# Patient Record
Sex: Female | Born: 1960 | Race: Black or African American | Hispanic: No | Smoking: Never smoker
Health system: Southern US, Community
[De-identification: ages and names within clinical notes are randomized; demographics above are authoritative.]

## PROBLEM LIST (undated history)

## (undated) DIAGNOSIS — I10 Essential (primary) hypertension: Secondary | ICD-10-CM

## (undated) HISTORY — PX: ABDOMINAL HYSTERECTOMY: SHX81

## (undated) HISTORY — PX: PARTIAL HYSTERECTOMY: SHX80

---

## 2004-05-10 ENCOUNTER — Ambulatory Visit: Payer: Self-pay | Admitting: Internal Medicine

## 2006-04-10 ENCOUNTER — Ambulatory Visit: Payer: Self-pay | Admitting: Nurse Practitioner

## 2007-12-23 ENCOUNTER — Ambulatory Visit: Payer: Self-pay | Admitting: Nurse Practitioner

## 2009-01-07 ENCOUNTER — Ambulatory Visit: Payer: Self-pay | Admitting: Nurse Practitioner

## 2010-05-22 ENCOUNTER — Ambulatory Visit: Payer: Self-pay | Admitting: Nurse Practitioner

## 2010-05-26 ENCOUNTER — Ambulatory Visit: Payer: Self-pay | Admitting: Nurse Practitioner

## 2011-07-11 ENCOUNTER — Ambulatory Visit: Payer: Self-pay | Admitting: Nurse Practitioner

## 2013-01-08 ENCOUNTER — Ambulatory Visit: Payer: Self-pay | Admitting: Family Medicine

## 2013-03-06 ENCOUNTER — Ambulatory Visit: Payer: Self-pay | Admitting: Family Medicine

## 2014-03-15 ENCOUNTER — Ambulatory Visit: Payer: Self-pay | Admitting: Family Medicine

## 2014-03-19 ENCOUNTER — Ambulatory Visit: Payer: Self-pay | Admitting: Family Medicine

## 2015-01-14 ENCOUNTER — Encounter: Payer: Self-pay | Admitting: Emergency Medicine

## 2015-01-14 ENCOUNTER — Emergency Department: Payer: Worker's Compensation

## 2015-01-14 ENCOUNTER — Emergency Department
Admission: EM | Admit: 2015-01-14 | Discharge: 2015-01-14 | Disposition: A | Discharge status: Home / Self Care | Payer: Worker's Compensation | Attending: Student | Admitting: Student

## 2015-01-14 DIAGNOSIS — S0093XA Contusion of unspecified part of head, initial encounter: Secondary | ICD-10-CM | POA: Insufficient documentation

## 2015-01-14 DIAGNOSIS — Y9389 Activity, other specified: Secondary | ICD-10-CM | POA: Diagnosis not present

## 2015-01-14 DIAGNOSIS — Y998 Other external cause status: Secondary | ICD-10-CM | POA: Diagnosis not present

## 2015-01-14 DIAGNOSIS — S0990XA Unspecified injury of head, initial encounter: Secondary | ICD-10-CM | POA: Diagnosis present

## 2015-01-14 DIAGNOSIS — I10 Essential (primary) hypertension: Secondary | ICD-10-CM | POA: Diagnosis not present

## 2015-01-14 DIAGNOSIS — W228XXA Striking against or struck by other objects, initial encounter: Secondary | ICD-10-CM | POA: Diagnosis not present

## 2015-01-14 DIAGNOSIS — Y9289 Other specified places as the place of occurrence of the external cause: Secondary | ICD-10-CM | POA: Insufficient documentation

## 2015-01-14 HISTORY — DX: Essential (primary) hypertension: I10

## 2015-01-14 MED ORDER — TRAMADOL HCL 50 MG PO TABS: 50.0000 mg | ORAL_TABLET | Freq: Four times a day (QID) | ORAL | Status: AC | PRN

## 2015-01-14 MED ORDER — ACETAMINOPHEN 500 MG PO TABS: 1000.0000 mg | ORAL_TABLET | Freq: Four times a day (QID) | ORAL | Status: AC | PRN

## 2015-01-14 MED ORDER — CLONIDINE HCL 0.1 MG PO TABS
ORAL_TABLET | ORAL | Status: AC
  Administered 2015-01-14: 0.1 mg via ORAL
  Filled 2015-01-14: qty 1

## 2015-01-14 MED ORDER — CLONIDINE HCL 0.1 MG PO TABS
0.1000 mg | ORAL_TABLET | Freq: Once | ORAL | Status: AC
  Administered 2015-01-14: 0.1 mg via ORAL
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
## 2015-01-14 NOTE — Discharge Instructions (Signed)
Concussion A concussion, or closed-head injury, is a brain injury caused by a direct blow to the head or by a quick and sudden movement (jolt) of the head or neck. Concussions are usually not life-threatening. Even so, the effects of a concussion can be serious. If you have had a concussion before, you are more likely to experience concussion-like symptoms after a direct blow to the head.  CAUSES  Direct blow to the head, such as from running into another player during a soccer game, being hit in a fight, or hitting your head on a hard surface.  A jolt of the head or neck that causes the brain to move back and forth inside the skull, such as in a car crash. SIGNS AND SYMPTOMS The signs of a concussion can be hard to notice. Early on, they may be missed by you, family members, and health care providers. You may look fine but act or feel differently. Symptoms are usually temporary, but they may last for days, weeks, or even longer. Some symptoms may appear right away while others may not show up for hours or days. Every head injury is different. Symptoms include:  Mild to moderate headaches that will not go away.  A feeling of pressure inside your head.  Having more trouble than usual:  Learning or remembering things you have heard.  Answering questions.  Paying attention or concentrating.  Organizing daily tasks.  Making decisions and solving problems.  Slowness in thinking, acting or reacting, speaking, or reading.  Getting lost or being easily confused.  Feeling tired all the time or lacking energy (fatigued).  Feeling drowsy.  Sleep disturbances.  Sleeping more than usual.  Sleeping less than usual.  Trouble falling asleep.  Trouble sleeping (insomnia).  Loss of balance or feeling lightheaded or dizzy.  Nausea or vomiting.  Numbness or tingling.  Increased sensitivity to:  Sounds.  Lights.  Distractions.  Vision problems or eyes that tire  easily.  Diminished sense of taste or smell.  Ringing in the ears.  Mood changes such as feeling sad or anxious.  Becoming easily irritated or angry for little or no reason.  Lack of motivation.  Seeing or hearing things other people do not see or hear (hallucinations). DIAGNOSIS Your health care provider can usually diagnose a concussion based on a description of your injury and symptoms. He or she will ask whether you passed out (lost consciousness) and whether you are having trouble remembering events that happened right before and during your injury. Your evaluation might include:  A brain scan to look for signs of injury to the brain. Even if the test shows no injury, you may still have a concussion.  Blood tests to be sure other problems are not present. TREATMENT  Concussions are usually treated in an emergency department, in urgent care, or at a clinic. You may need to stay in the hospital overnight for further treatment.  Tell your health care provider if you are taking any medicines, including prescription medicines, over-the-counter medicines, and natural remedies. Some medicines, such as blood thinners (anticoagulants) and aspirin, may increase the chance of complications. Also tell your health care provider whether you have had alcohol or are taking illegal drugs. This information may affect treatment.  Your health care provider will send you home with important instructions to follow.  How fast you will recover from a concussion depends on many factors. These factors include how severe your concussion is, what part of your brain was injured, your  age, and how healthy you were before the concussion. °· Most people with mild injuries recover fully. Recovery can take time. In general, recovery is slower in older persons. Also, persons who have had a concussion in the past or have other medical problems may find that it takes longer to recover from their current injury. °HOME  CARE INSTRUCTIONS °General Instructions °· Carefully follow the directions your health care provider gave you. °· Only take over-the-counter or prescription medicines for pain, discomfort, or fever as directed by your health care provider. °· Take only those medicines that your health care provider has approved. °· Do not drink alcohol until your health care provider says you are well enough to do so. Alcohol and certain other drugs may slow your recovery and can put you at risk of further injury. °· If it is harder than usual to remember things, write them down. °· If you are easily distracted, try to do one thing at a time. For example, do not try to watch TV while fixing dinner. °· Talk with family members or close friends when making important decisions. °· Keep all follow-up appointments. Repeated evaluation of your symptoms is recommended for your recovery. °· Watch your symptoms and tell others to do the same. Complications sometimes occur after a concussion. Older adults with a brain injury may have a higher risk of serious complications, such as a blood clot on the brain. °· Tell your teachers, school nurse, school counselor, coach, athletic trainer, or work manager about your injury, symptoms, and restrictions. Tell them about what you can or cannot do. They should watch for: °¨ Increased problems with attention or concentration. °¨ Increased difficulty remembering or learning new information. °¨ Increased time needed to complete tasks or assignments. °¨ Increased irritability or decreased ability to cope with stress. °¨ Increased symptoms. °· Rest. Rest helps the brain to heal. Make sure you: °¨ Get plenty of sleep at night. Avoid staying up late at night. °¨ Keep the same bedtime hours on weekends and weekdays. °¨ Rest during the day. Take daytime naps or rest breaks when you feel tired. °· Limit activities that require a lot of thought or concentration. These include: °¨ Doing homework or job-related  work. °¨ Watching TV. °¨ Working on the computer. °· Avoid any situation where there is potential for another head injury (football, hockey, soccer, basketball, martial arts, downhill snow sports and horseback riding). Your condition will get worse every time you experience a concussion. You should avoid these activities until you are evaluated by the appropriate follow-up health care providers. °Returning To Your Regular Activities °You will need to return to your normal activities slowly, not all at once. You must give your body and brain enough time for recovery. °· Do not return to sports or other athletic activities until your health care provider tells you it is safe to do so. °· Ask your health care provider when you can drive, ride a bicycle, or operate heavy machinery. Your ability to react may be slower after a brain injury. Never do these activities if you are dizzy. °· Ask your health care provider about when you can return to work or school. °Preventing Another Concussion °It is very important to avoid another brain injury, especially before you have recovered. In rare cases, another injury can lead to permanent brain damage, brain swelling, or death. The risk of this is greatest during the first 7-10 days after a head injury. Avoid injuries by: °· Wearing a seat   belt when riding in a car.  Drinking alcohol only in moderation.  Wearing a helmet when biking, skiing, skateboarding, skating, or doing similar activities.  Avoiding activities that could lead to a second concussion, such as contact or recreational sports, until your health care provider says it is okay.  Taking safety measures in your home.  Remove clutter and tripping hazards from floors and stairways.  Use grab bars in bathrooms and handrails by stairs.  Place non-slip mats on floors and in bathtubs.  Improve lighting in dim areas. SEEK MEDICAL CARE IF:  You have increased problems paying attention or  concentrating.  You have increased difficulty remembering or learning new information.  You need more time to complete tasks or assignments than before.  You have increased irritability or decreased ability to cope with stress.  You have more symptoms than before. Seek medical care if you have any of the following symptoms for more than 2 weeks after your injury:  Lasting (chronic) headaches.  Dizziness or balance problems.  Nausea.  Vision problems.  Increased sensitivity to noise or light.  Depression or mood swings.  Anxiety or irritability.  Memory problems.  Difficulty concentrating or paying attention.  Sleep problems.  Feeling tired all the time. SEEK IMMEDIATE MEDICAL CARE IF:  You have severe or worsening headaches. These may be a sign of a blood clot in the brain.  You have weakness (even if only in one hand, leg, or part of the face).  You have numbness.  You have decreased coordination.  You vomit repeatedly.  You have increased sleepiness.  One pupil is larger than the other.  You have convulsions.  You have slurred speech.  You have increased confusion. This may be a sign of a blood clot in the brain.  You have increased restlessness, agitation, or irritability.  You are unable to recognize people or places.  You have neck pain.  It is difficult to wake you up.  You have unusual behavior changes.  You lose consciousness. MAKE SURE YOU:  Understand these instructions.  Will watch your condition.  Will get help right away if you are not doing well or get worse. Document Released: 09/22/2003 Document Revised: 07/07/2013 Document Reviewed: 01/22/2013 [facility] Patient Information 2015 [place], [place]. This information is not intended to replace advice given to you by your health care provider. Make sure you discuss any questions you have with your health care provider.  Blunt Trauma You have been evaluated for injuries. You have  been examined and your caregiver has not found injuries serious enough to require hospitalization. It is common to have multiple bruises and sore muscles following an accident. These tend to feel worse for the first 24 hours. You will feel more stiffness and soreness over the next several hours and worse when you wake up the first morning after your accident. After this point, you should begin to improve with each passing day. The amount of improvement depends on the amount of damage done in the accident. Following your accident, if some part of your body does not work as it should, or if the pain in any area continues to increase, you should return to the Emergency Department for re-evaluation.  HOME CARE INSTRUCTIONS  Routine care for sore areas should include:  Ice to sore areas every 2 hours for 20 minutes while awake for the next 2 days.  Drink extra fluids (not alcohol).  Take a hot or warm shower or bath once or twice a day  to increase blood flow to sore muscles. This will help you "limber up".  Activity as tolerated. Lifting may aggravate neck or back pain.  Only take over-the-counter or prescription medicines for pain, discomfort, or fever as directed by your caregiver. Do not use aspirin. This may increase bruising or increase bleeding if there are small areas where this is happening. SEEK IMMEDIATE MEDICAL CARE IF:  Numbness, tingling, weakness, or problem with the use of your arms or legs.  A severe headache is not relieved with medications.  There is a change in bowel or bladder control.  Increasing pain in any areas of the body.  Short of breath or dizzy.  Nauseated, vomiting, or sweating.  Increasing belly (abdominal) discomfort.  Blood in urine, stool, or vomiting blood.  Pain in either shoulder in an area where a shoulder strap would be.  Feelings of lightheadedness or if you have a fainting episode. Sometimes it is not possible to identify all injuries  immediately after the trauma. It is important that you continue to monitor your condition after the emergency department visit. If you feel you are not improving, or improving more slowly than should be expected, call your physician. If you feel your symptoms (problems) are worsening, return to the Emergency Department immediately. Document Released: 03/28/2001 Document Revised: 09/24/2011 Document Reviewed: 02/18/2008 [facility] Patient Information 2015 [place], [place]. This information is not intended to replace advice given to you by your health care provider. Make sure you discuss any questions you have with your health care provider.  Contusion A contusion is a deep bruise. Contusions are the result of an injury that caused bleeding under the skin. The contusion may turn blue, purple, or yellow. Minor injuries will give you a painless contusion, but more severe contusions may stay painful and swollen for a few weeks.  CAUSES  A contusion is usually caused by a blow, trauma, or direct force to an area of the body. SYMPTOMS   Swelling and redness of the injured area.  Bruising of the injured area.  Tenderness and soreness of the injured area.  Pain. DIAGNOSIS  The diagnosis can be made by taking a history and physical exam. An X-ray, CT scan, or MRI may be needed to determine if there were any associated injuries, such as fractures. TREATMENT  Specific treatment will depend on what area of the body was injured. In general, the best treatment for a contusion is resting, icing, elevating, and applying cold compresses to the injured area. Over-the-counter medicines may also be recommended for pain control. Ask your caregiver what the best treatment is for your contusion. HOME CARE INSTRUCTIONS   Put ice on the injured area.  Put ice in a plastic bag.  Place a towel between your skin and the bag.  Leave the ice on for 15-20 minutes, 3-4 times a day, or as directed by your health care  provider.  Only take over-the-counter or prescription medicines for pain, discomfort, or fever as directed by your caregiver. Your caregiver may recommend avoiding anti-inflammatory medicines (aspirin, ibuprofen, and naproxen) for 48 hours because these medicines may increase bruising.  Rest the injured area.  If possible, elevate the injured area to reduce swelling. SEEK IMMEDIATE MEDICAL CARE IF:   You have increased bruising or swelling.  You have pain that is getting worse.  Your swelling or pain is not relieved with medicines. MAKE SURE YOU:   Understand these instructions.  Will watch your condition.  Will get help right away if you  are not doing well or get worse. Document Released: 04/11/2005 Document Revised: 07/07/2013 Document Reviewed: 05/07/2011 [facility] Patient Information 2015 [place]. This information is not intended to replace advice given to you by your health care provider. Make sure you discuss any questions you have with your health care provider.
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
## 2015-10-13 ENCOUNTER — Other Ambulatory Visit: Payer: Self-pay | Admitting: Physician Assistant

## 2015-10-13 DIAGNOSIS — Z1231 Encounter for screening mammogram for malignant neoplasm of breast: Secondary | ICD-10-CM

## 2015-12-14 ENCOUNTER — Ambulatory Visit
Admission: RE | Admit: 2015-12-14 | Discharge: 2015-12-14 | Disposition: A | Discharge status: Home / Self Care | Payer: BLUE CROSS/BLUE SHIELD | Source: Ambulatory Visit | Attending: Physician Assistant | Admitting: Physician Assistant

## 2015-12-14 DIAGNOSIS — Z1231 Encounter for screening mammogram for malignant neoplasm of breast: Secondary | ICD-10-CM | POA: Diagnosis present

## 2016-10-13 ENCOUNTER — Emergency Department
Admission: EM | Admit: 2016-10-13 | Discharge: 2016-10-13 | Disposition: A | Discharge status: Home / Self Care | Payer: BLUE CROSS/BLUE SHIELD | Attending: Emergency Medicine | Admitting: Emergency Medicine

## 2016-10-13 ENCOUNTER — Encounter: Payer: Self-pay | Admitting: Emergency Medicine

## 2016-10-13 ENCOUNTER — Emergency Department: Payer: BLUE CROSS/BLUE SHIELD

## 2016-10-13 DIAGNOSIS — Z79899 Other long term (current) drug therapy: Secondary | ICD-10-CM | POA: Diagnosis not present

## 2016-10-13 DIAGNOSIS — I10 Essential (primary) hypertension: Secondary | ICD-10-CM | POA: Insufficient documentation

## 2016-10-13 DIAGNOSIS — R109 Unspecified abdominal pain: Secondary | ICD-10-CM | POA: Insufficient documentation

## 2016-10-13 DIAGNOSIS — M545 Low back pain: Secondary | ICD-10-CM | POA: Insufficient documentation

## 2016-10-13 DIAGNOSIS — R52 Pain, unspecified: Secondary | ICD-10-CM

## 2016-10-13 LAB — CBC WITH DIFFERENTIAL/PLATELET
Basophils Absolute: 0.1 10*3/uL (ref 0–0.1)
Basophils Relative: 1 %
EOS ABS: 0.1 10*3/uL (ref 0–0.7)
Eosinophils Relative: 2 %
HCT: 41 % (ref 35.0–47.0)
Hemoglobin: 14 g/dL (ref 12.0–16.0)
Lymphocytes Relative: 16 %
Lymphs Abs: 1.4 10*3/uL (ref 1.0–3.6)
MCH: 30.3 pg (ref 26.0–34.0)
MCHC: 34.1 g/dL (ref 32.0–36.0)
MCV: 88.9 fL (ref 80.0–100.0)
MONO ABS: 0.7 10*3/uL (ref 0.2–0.9)
MONOS PCT: 8 %
Neutro Abs: 6.7 10*3/uL — ABNORMAL HIGH (ref 1.4–6.5)
Neutrophils Relative %: 73 %
PLATELETS: 252 10*3/uL (ref 150–440)
RBC: 4.61 MIL/uL (ref 3.80–5.20)
RDW: 13.7 % (ref 11.5–14.5)
WBC: 9.1 10*3/uL (ref 3.6–11.0)

## 2016-10-13 LAB — COMPREHENSIVE METABOLIC PANEL
ALT: 28 U/L (ref 14–54)
AST: 28 U/L (ref 15–41)
Albumin: 4.4 g/dL (ref 3.5–5.0)
Alkaline Phosphatase: 73 U/L (ref 38–126)
Anion gap: 7 (ref 5–15)
BILIRUBIN TOTAL: 0.6 mg/dL (ref 0.3–1.2)
BUN: 19 mg/dL (ref 6–20)
CALCIUM: 9.9 mg/dL (ref 8.9–10.3)
CO2: 32 mmol/L (ref 22–32)
Chloride: 100 mmol/L — ABNORMAL LOW (ref 101–111)
Creatinine, Ser: 1.43 mg/dL — ABNORMAL HIGH (ref 0.44–1.00)
GFR calc Af Amer: 46 mL/min — ABNORMAL LOW (ref >60)
GFR, EST NON AFRICAN AMERICAN: 40 mL/min — AB (ref >60)
GLUCOSE: 87 mg/dL (ref 65–99)
POTASSIUM: 2.8 mmol/L — AB (ref 3.5–5.1)
Sodium: 139 mmol/L (ref 135–145)
TOTAL PROTEIN: 8.4 g/dL — AB (ref 6.5–8.1)

## 2016-10-13 LAB — URINALYSIS, COMPLETE (UACMP) WITH MICROSCOPIC
Bacteria, UA: NONE SEEN
Bilirubin Urine: NEGATIVE
GLUCOSE, UA: NEGATIVE mg/dL
HGB URINE DIPSTICK: NEGATIVE
Ketones, ur: NEGATIVE mg/dL
Leukocytes, UA: NEGATIVE
NITRITE: NEGATIVE
PH: 5 (ref 5.0–8.0)
Protein, ur: 30 mg/dL — AB
SPECIFIC GRAVITY, URINE: 1.013 (ref 1.005–1.030)
WBC, UA: NONE SEEN WBC/hpf (ref 0–5)

## 2016-10-13 LAB — POCT PREGNANCY, URINE: Preg Test, Ur: NEGATIVE

## 2016-10-13 LAB — LIPASE, BLOOD: LIPASE: 16 U/L (ref 11–51)

## 2016-10-13 LAB — PREGNANCY, URINE: Preg Test, Ur: NEGATIVE

## 2016-10-13 MED ORDER — MORPHINE SULFATE (PF) 4 MG/ML IV SOLN
6.0000 mg | Freq: Once | INTRAVENOUS | Status: AC
  Administered 2016-10-13: 6 mg via INTRAVENOUS
  Filled 2016-10-13: qty 2

## 2016-10-13 MED ORDER — HYDROCODONE-ACETAMINOPHEN 5-325 MG PO TABS: 2.0000 | ORAL_TABLET | Freq: Four times a day (QID) | ORAL | 0 refills | Status: AC | PRN

## 2016-10-13 MED ORDER — ONDANSETRON HCL 4 MG/2ML IJ SOLN
4.0000 mg | Freq: Once | INTRAMUSCULAR | Status: AC
  Administered 2016-10-13: 4 mg via INTRAVENOUS
  Filled 2016-10-13: qty 2

## 2016-10-13 MED ORDER — MORPHINE SULFATE (PF) 4 MG/ML IV SOLN
4.0000 mg | Freq: Once | INTRAVENOUS | Status: AC
  Administered 2016-10-13: 4 mg via INTRAVENOUS
  Filled 2016-10-13: qty 1

## 2016-10-13 MED ORDER — POTASSIUM CHLORIDE 20 MEQ/15ML (10%) PO SOLN
40.0000 meq | Freq: Once | ORAL | Status: DC
  Filled 2016-10-13: qty 30

## 2016-10-13 MED ORDER — POTASSIUM CHLORIDE CRYS ER 20 MEQ PO TBCR
40.0000 meq | EXTENDED_RELEASE_TABLET | Freq: Once | ORAL | Status: AC
  Administered 2016-10-13: 40 meq via ORAL
  Filled 2016-10-13: qty 2

## 2016-10-13 NOTE — Discharge Instructions (Signed)
Use the Vicodin one or 2 pills 4 times a day as needed for pain. Use ice or heat to the area 20 minutes every hour to help with the pain. Do not fall asleep on either ice or heat as you can get burns or frostbite. With your regular doctor next 3-4 days unless you're well. Return here for worse pain fever or numbness or weakness or feeling sicker.

## 2016-10-13 NOTE — ED Triage Notes (Signed)
Pt reports right side flank pain and low back pain on right side. Pt denies dysuria. Pt denies NVD. Pt tearful in triage.

## 2016-10-13 NOTE — ED Provider Notes (Signed)
[facility] Emergency Department Provider Note   ____________________________________________   First MD Initiated Contact with Patient 10/13/16 1128     (approximate)  I have reviewed the triage vital signs and the nursing notes.   HISTORY  Chief Complaint Flank Pain    HPI Diana Boone is a 56 y.o. female who reports right flank pain starting yesterday around 3:00. Pain is sharp worse with movement. Chest no fever chills nausea vomiting diarrhea dysuria urgency frequency or any other complaints. Pain is actually closer to the right sacroiliac joint than anywhere else.  Past Medical History:  Diagnosis Date  . Hypertension     There are no active problems to display for this patient.   Past Surgical History:  Procedure Laterality Date  . PARTIAL HYSTERECTOMY      Prior to Admission medications   Medication Sig Start Date End Date Taking? Authorizing Provider  acetaminophen (TYLENOL) 325 MG tablet Take 650 mg by mouth every 6 (six) hours as needed.   Yes Historical Provider, MD  cetirizine (ZYRTEC) 10 MG tablet Take 10 mg by mouth daily.   Yes Historical Provider, MD  losartan-hydrochlorothiazide (HYZAAR) 100-12.5 MG tablet Take 1 tablet by mouth daily. 09/13/16  Yes Historical Provider, MD  HYDROcodone-acetaminophen (NORCO/VICODIN) 5-325 MG tablet Take 2 tablets by mouth every 6 (six) hours as needed for moderate pain. 10/13/16 10/13/17  Arnaldo Natal, MD  traMADol (ULTRAM) 50 MG tablet Take 1 tablet (50 mg total) by mouth every 6 (six) hours as needed. Patient not taking: Reported on 10/13/2016 01/14/15   Evon Slack, PA-C    Allergies Ibuprofen  Family History  Problem Relation Age of Onset  . Breast cancer Sister 73    Social History Social History  Substance Use Topics  . Smoking status: Never Smoker  . Smokeless tobacco: Not on file  . Alcohol use No    Review of Systems Constitutional: No fever/chills Eyes: No visual  changes. ENT: No sore throat. Cardiovascular: Denies chest pain. Respiratory: Denies shortness of breath. Gastrointestinal: No abdominal pain.  No nausea, no vomiting.  No diarrhea.  No constipation. Genitourinary: Negative for dysuria. Musculoskeletal: Negative for back pain. Skin: Negative for rash. Neurological: Negative for headaches, focal weakness or numbness. { 10-point ROS otherwise negative.  ____________________________________________   PHYSICAL EXAM:  VITAL SIGNS: ED Triage Vitals  Enc Vitals Group     BP 10/13/16 1117 (!) 170/119     Pulse Rate 10/13/16 1117 69     Resp 10/13/16 1117 18     Temp 10/13/16 1117 97.5 F (36.4 C)     Temp Source 10/13/16 1117 Oral     SpO2 10/13/16 1117 99 %     Weight 10/13/16 1118 160 lb (72.6 kg)     Height 10/13/16 1118  (1.575 m)     Head Circumference --      Peak Flow --      Pain Score 10/13/16 1117 10     Pain Loc --      Pain Edu? --      Excl. in GC? --     Constitutional: Alert and oriented. Well appearing and in no acute distress. Eyes: Conjunctivae are normal. PERRL. EOMI. Head: Atraumatic. Nose: No congestion/rhinnorhea. Mouth/Throat: Mucous membranes are moist.  Oropharynx non-erythematous. Neck: No stridor.  Cardiovascular: Normal rate, regular rhythm. Grossly normal heart sounds.  Good peripheral circulation. Respiratory: Normal respiratory effort.  No retractions. Lungs CTAB. Gastrointestinal: Soft and nontender. No  distention. No abdominal bruits. No CVA tenderness.Patient does have pain to palpation over the area of the right SI joint. Musculoskeletal: No lower extremity tenderness nor edema.  No joint effusions. Neurologic:  Normal speech and language. No gross focal neurologic deficits are appreciated. No gait instability. Skin:  Skin is warm, dry and intact. No rash noted. Psychiatric: Mood and affect are normal. Speech and behavior are normal.  ____________________________________________     LABS (all labs ordered are listed, but only abnormal results are displayed)  Labs Reviewed  COMPREHENSIVE METABOLIC PANEL - Abnormal; Notable for the following:       Result Value   Potassium 2.8 (*)    Chloride 100 (*)    Creatinine, Ser 1.43 (*)    Total Protein 8.4 (*)    GFR calc non Af Amer 40 (*)    GFR calc Af Amer 46 (*)    All other components within normal limits  CBC WITH DIFFERENTIAL/PLATELET - Abnormal; Notable for the following:    Neutro Abs 6.7 (*)    All other components within normal limits  URINALYSIS, COMPLETE (UACMP) WITH MICROSCOPIC - Abnormal; Notable for the following:    Color, Urine YELLOW (*)    APPearance CLEAR (*)    Protein, ur 30 (*)    Squamous Epithelial / LPF 0-5 (*)    All other components within normal limits  LIPASE, BLOOD  PREGNANCY, URINE  POCT PREGNANCY, URINE   ____________________________________________  EKG   ____________________________________________  RADIOLOGY  udy Result   CLINICAL DATA:  Pt reports right side flank pain and low back pain on right side. Pt denies dysuria. Pt denies NVD. Denies hx of kidney stones.  EXAM: CT ABDOMEN AND PELVIS WITHOUT CONTRAST  TECHNIQUE: Multidetector CT imaging of the abdomen and pelvis was performed following the standard protocol without IV contrast.  COMPARISON:  None.  FINDINGS: Lower chest: No acute abnormality.  Hepatobiliary: 1 cm low-density lesion arises from segment 8 of the liver. This is not consistent with a cyst. It may reflect a small hemangioma but is nonspecific. No other liver abnormality. Normal gallbladder. No bile duct dilation.  Pancreas: Unremarkable. No pancreatic ductal dilatation or surrounding inflammatory changes.  Spleen: Normal in size without focal abnormality.  Adrenals/Urinary Tract: Adrenal glands are unremarkable. Kidneys are normal, without renal calculi, focal lesion, or hydronephrosis. Bladder is  unremarkable.  Stomach/Bowel: Stomach, small bowel and colon are unremarkable. Appendix not visualized. No evidence of appendicitis.  Vascular/Lymphatic: No significant vascular findings are present. No enlarged abdominal or pelvic lymph nodes.  Reproductive: Status post hysterectomy. No adnexal masses.  Other: No abdominal wall hernia or abnormality. No abdominopelvic ascites.  Musculoskeletal: No acute or significant osseous findings.  IMPRESSION: 1. No acute findings. No findings to account for the patient's right flank pain. Specifically, no renal or ureteral stones or obstructive uropathy. 2. 1 cm low-density liver lesion. This is of unclear etiology. Consider further assessment with liver MRI with and without contrast. 3. Status post hysterectomy.   Electronically Signed   By: Amie Portland M.D.   On: 10/13/2016 12:14     ____________________________________________   PROCEDURES  Procedure(s) performed:   Procedures  Critical Care performed:  ____________________________________________   INITIAL IMPRESSION / ASSESSMENT AND PLAN / ED COURSE  Pertinent labs & imaging results that were available during my care of the patient were reviewed by me and considered in my medical decision making (see chart for details).        ____________________________________________  FINAL CLINICAL IMPRESSION(S) / ED DIAGNOSES  Final diagnoses:  Pain  Right flank pain      NEW MEDICATIONS STARTED DURING THIS VISIT:  New Prescriptions   HYDROCODONE-ACETAMINOPHEN (NORCO/VICODIN) 5-325 MG TABLET    Take 2 tablets by mouth every 6 (six) hours as needed for moderate pain.     Note:  This document was prepared using Dragon voice recognition software and may include unintentional dictation errors.    Arnaldo Natal, MD 10/13/16 [phone]

## 2016-10-13 NOTE — ED Notes (Signed)
Pt taken to CT via stretcher.

## 2016-11-13 ENCOUNTER — Other Ambulatory Visit: Payer: Self-pay | Admitting: Physician Assistant

## 2016-11-13 DIAGNOSIS — Z1231 Encounter for screening mammogram for malignant neoplasm of breast: Secondary | ICD-10-CM

## 2017-01-03 ENCOUNTER — Ambulatory Visit
Admission: RE | Admit: 2017-01-03 | Discharge: 2017-01-03 | Disposition: A | Discharge status: Home / Self Care | Payer: BLUE CROSS/BLUE SHIELD | Source: Ambulatory Visit | Attending: Physician Assistant | Admitting: Physician Assistant

## 2017-01-03 DIAGNOSIS — Z1231 Encounter for screening mammogram for malignant neoplasm of breast: Secondary | ICD-10-CM | POA: Diagnosis not present

## 2017-12-18 ENCOUNTER — Other Ambulatory Visit: Payer: Self-pay | Admitting: Nurse Practitioner

## 2017-12-18 DIAGNOSIS — Z1239 Encounter for other screening for malignant neoplasm of breast: Secondary | ICD-10-CM

## 2018-01-31 ENCOUNTER — Ambulatory Visit
Admission: RE | Admit: 2018-01-31 | Discharge: 2018-01-31 | Disposition: A | Discharge status: Home / Self Care | Payer: BLUE CROSS/BLUE SHIELD | Source: Ambulatory Visit | Attending: Nurse Practitioner | Admitting: Nurse Practitioner

## 2018-01-31 DIAGNOSIS — Z1231 Encounter for screening mammogram for malignant neoplasm of breast: Secondary | ICD-10-CM | POA: Diagnosis not present

## 2018-01-31 DIAGNOSIS — Z1239 Encounter for other screening for malignant neoplasm of breast: Secondary | ICD-10-CM

## 2018-12-23 ENCOUNTER — Other Ambulatory Visit: Payer: Self-pay | Admitting: Nurse Practitioner

## 2018-12-23 DIAGNOSIS — Z1231 Encounter for screening mammogram for malignant neoplasm of breast: Secondary | ICD-10-CM

## 2019-02-04 ENCOUNTER — Ambulatory Visit
Admission: RE | Admit: 2019-02-04 | Discharge: 2019-02-04 | Disposition: A | Discharge status: Home / Self Care | Payer: 59 | Source: Ambulatory Visit | Attending: Nurse Practitioner | Admitting: Nurse Practitioner

## 2019-02-04 ENCOUNTER — Other Ambulatory Visit: Payer: Self-pay

## 2019-02-04 DIAGNOSIS — Z1231 Encounter for screening mammogram for malignant neoplasm of breast: Secondary | ICD-10-CM

## 2019-07-29 ENCOUNTER — Other Ambulatory Visit: Payer: Self-pay | Admitting: Nurse Practitioner

## 2019-07-29 DIAGNOSIS — R519 Headache, unspecified: Secondary | ICD-10-CM

## 2019-08-12 ENCOUNTER — Ambulatory Visit
Admission: RE | Admit: 2019-08-12 | Discharge: 2019-08-12 | Disposition: A | Discharge status: Home / Self Care | Payer: 59 | Source: Ambulatory Visit | Attending: Nurse Practitioner | Admitting: Nurse Practitioner

## 2019-08-12 ENCOUNTER — Other Ambulatory Visit: Payer: Self-pay

## 2019-08-12 DIAGNOSIS — R519 Headache, unspecified: Secondary | ICD-10-CM | POA: Diagnosis not present

## 2020-01-27 ENCOUNTER — Other Ambulatory Visit: Payer: Self-pay | Admitting: Nurse Practitioner

## 2020-01-27 DIAGNOSIS — Z1231 Encounter for screening mammogram for malignant neoplasm of breast: Secondary | ICD-10-CM

## 2020-03-15 ENCOUNTER — Other Ambulatory Visit: Payer: Self-pay

## 2020-03-15 ENCOUNTER — Ambulatory Visit
Admission: RE | Admit: 2020-03-15 | Discharge: 2020-03-15 | Disposition: A | Discharge status: Home / Self Care | Payer: BC Managed Care – PPO | Source: Ambulatory Visit | Attending: Nurse Practitioner | Admitting: Nurse Practitioner

## 2020-03-15 DIAGNOSIS — Z1231 Encounter for screening mammogram for malignant neoplasm of breast: Secondary | ICD-10-CM

## 2021-02-28 ENCOUNTER — Other Ambulatory Visit: Payer: Self-pay | Admitting: Nurse Practitioner

## 2021-02-28 DIAGNOSIS — Z1231 Encounter for screening mammogram for malignant neoplasm of breast: Secondary | ICD-10-CM

## 2021-03-16 ENCOUNTER — Other Ambulatory Visit: Payer: Self-pay

## 2021-03-16 ENCOUNTER — Ambulatory Visit
Admission: RE | Admit: 2021-03-16 | Discharge: 2021-03-16 | Disposition: A | Discharge status: Home / Self Care | Payer: BC Managed Care – PPO | Source: Ambulatory Visit | Attending: Nurse Practitioner | Admitting: Nurse Practitioner

## 2021-03-16 DIAGNOSIS — Z1231 Encounter for screening mammogram for malignant neoplasm of breast: Secondary | ICD-10-CM | POA: Diagnosis not present

## 2021-12-11 IMAGING — MG MM DIGITAL SCREENING BILAT W/ TOMO AND CAD
8 series · 8 of 24 positions shown · non-contrast
Comparison: Previous exam(s).

CLINICAL DATA: Screening.

EXAM:
DIGITAL SCREENING BILATERAL MAMMOGRAM WITH TOMOSYNTHESIS AND CAD
TECHNIQUE: Bilateral screening digital craniocaudal and mediolateral oblique
mammograms were obtained. Bilateral screening digital breast
tomosynthesis was performed. The images were evaluated with
computer-aided detection.

[R CC synth-2D]
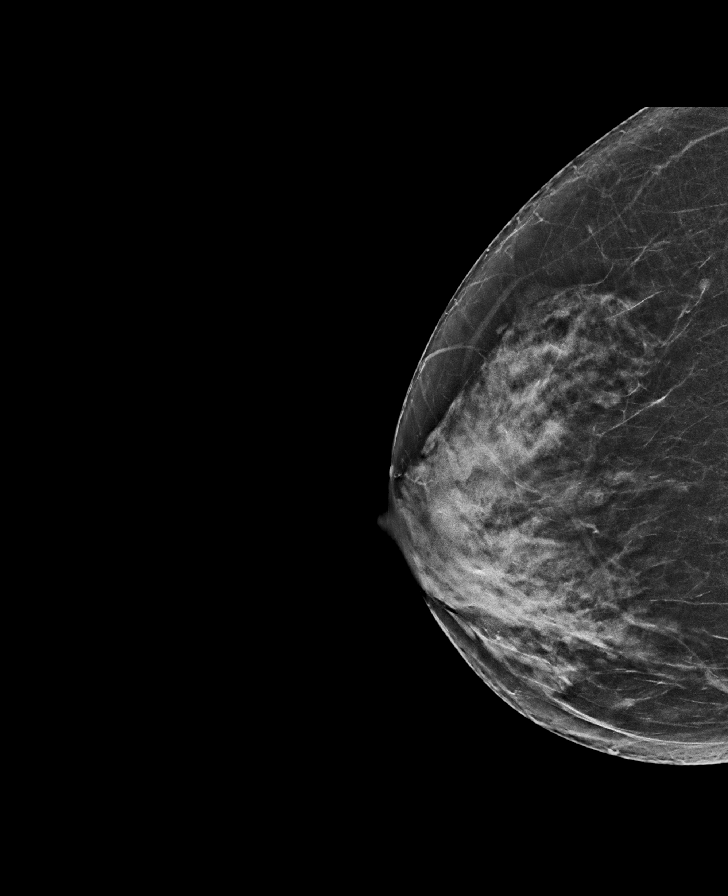

[R MLO synth-2D]
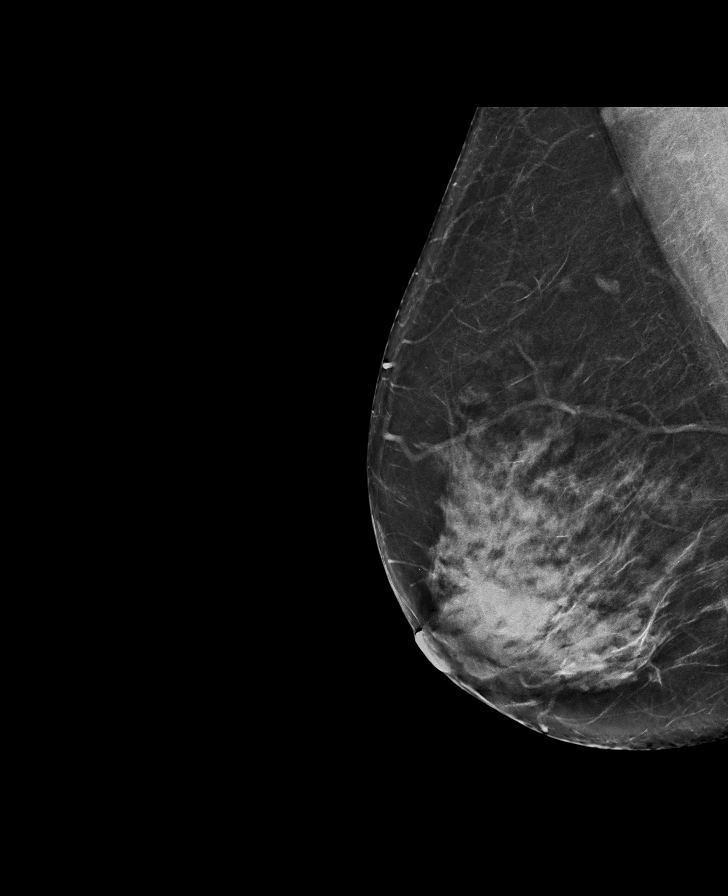

[L CC synth-2D]
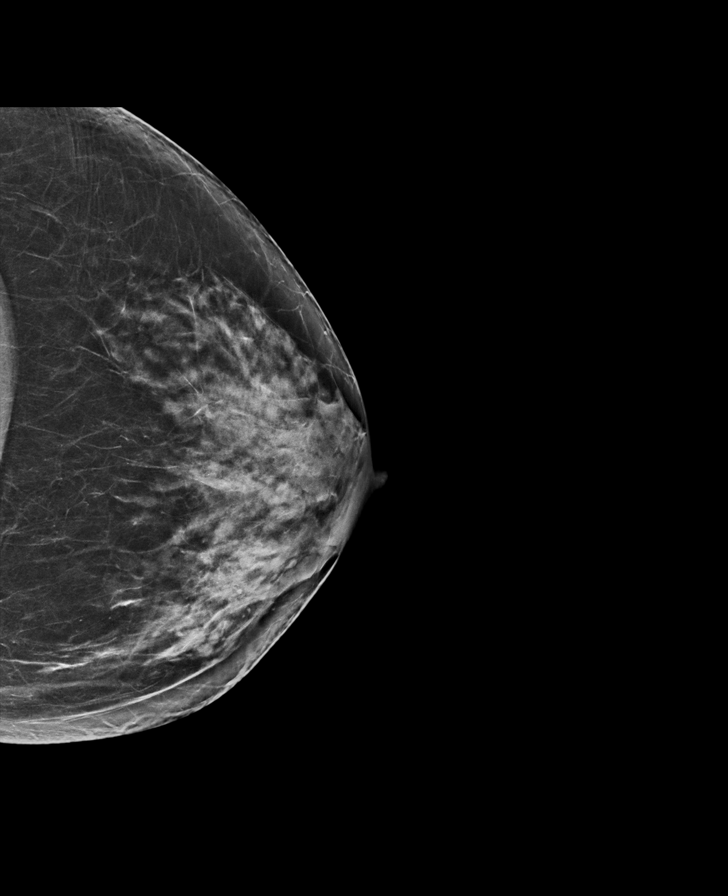

[L MLO synth-2D]
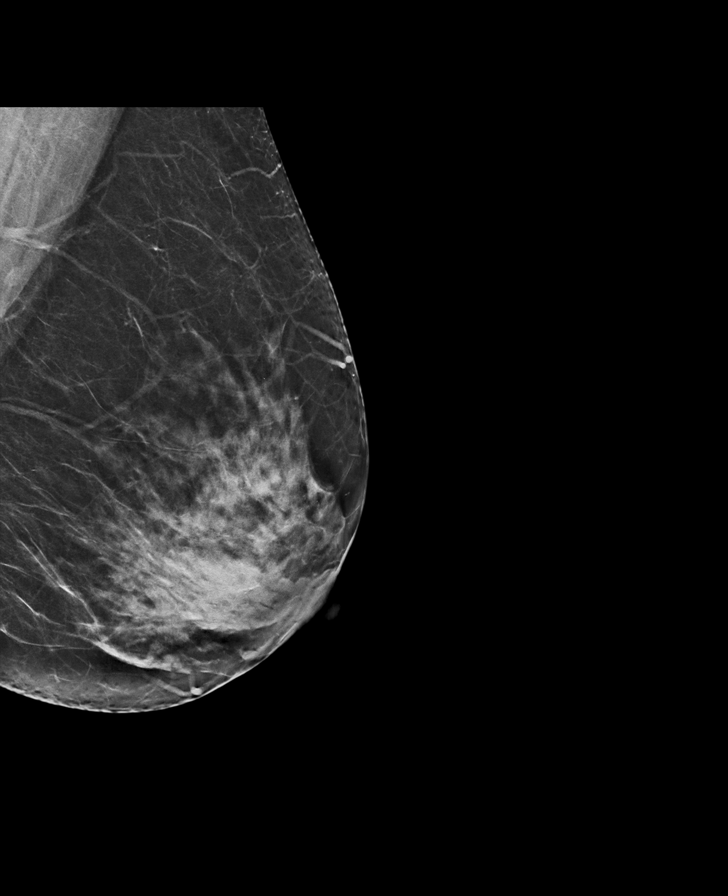

[R CC tomo · tomo slice 33/65.0]
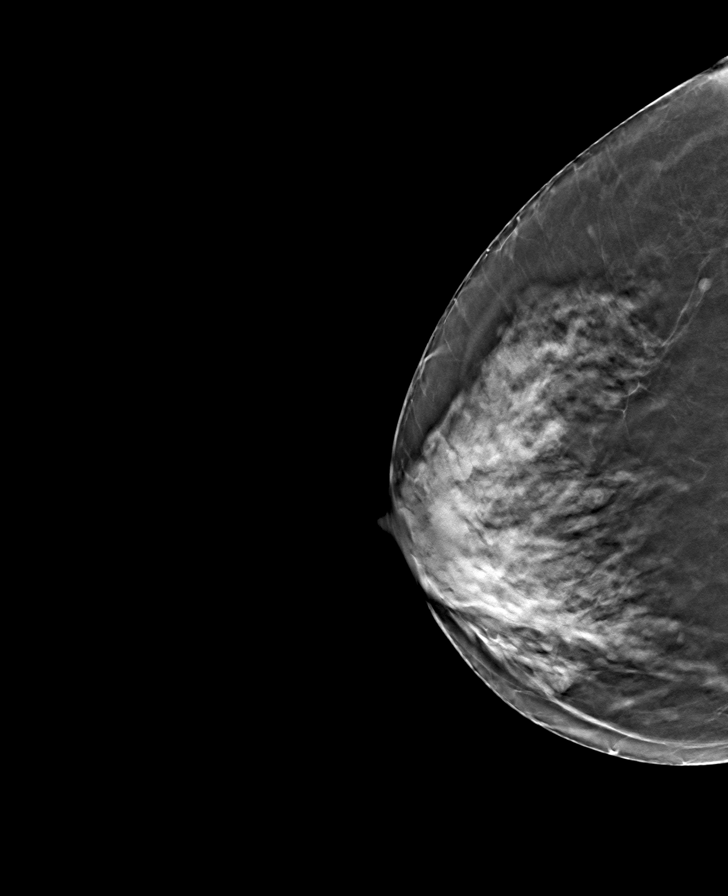

[L MLO tomo · tomo slice 38/75.0]
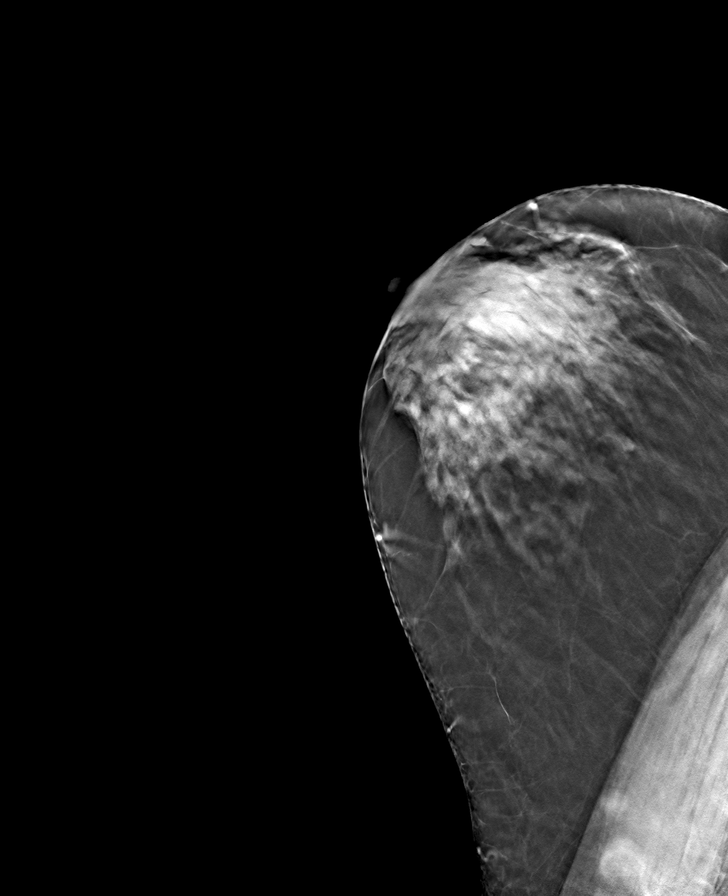

[L CC tomo · tomo slice 37/74.0]
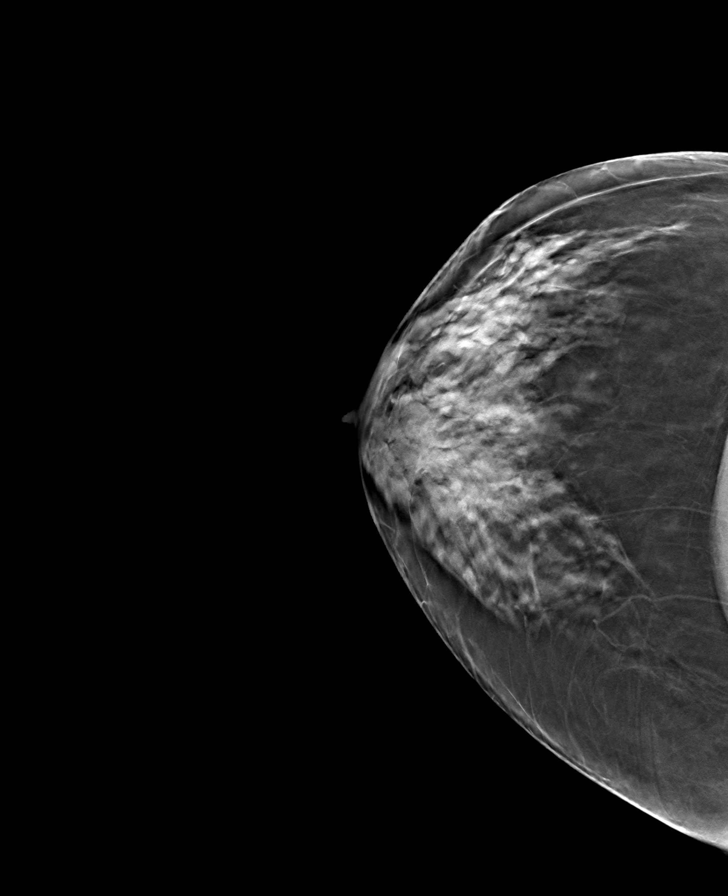

[R MLO tomo · tomo slice 37/74.0]
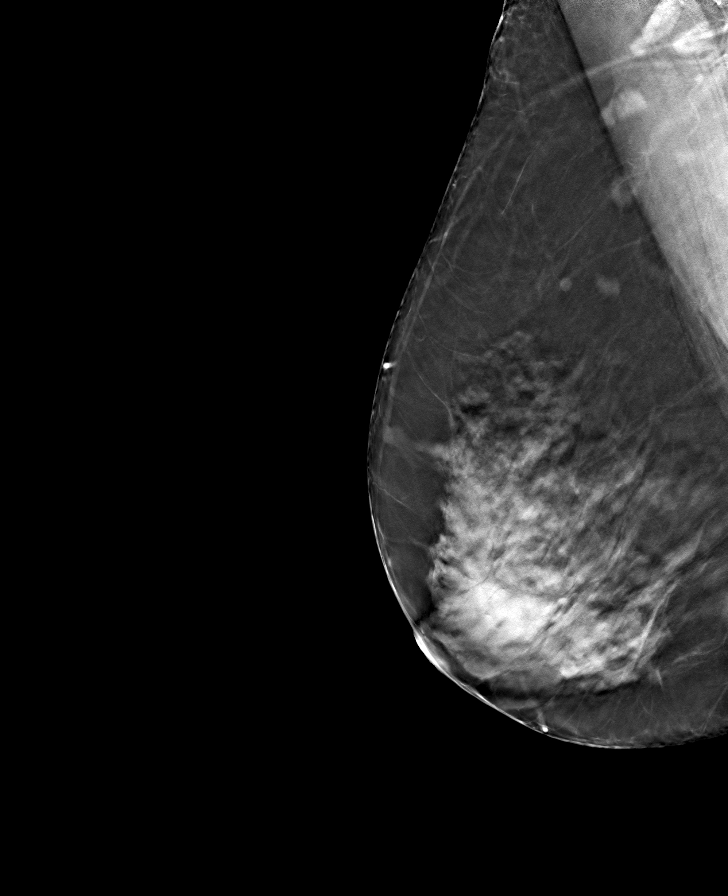

[8 of 24 positions shown; findings below may reference images not displayed]

ACR Breast Density Category c: The breast tissue is heterogeneously
dense, which may obscure small masses.
FINDINGS: There are no findings suspicious for malignancy.
IMPRESSION: No mammographic evidence of malignancy. A result letter of this
screening mammogram will be mailed directly to the patient.

RECOMMENDATION:
Screening mammogram in one year. (Code:Q3-W-BC3)

BI-RADS CATEGORY  1: Negative.

## 2022-03-02 ENCOUNTER — Other Ambulatory Visit: Payer: Self-pay | Admitting: Nurse Practitioner

## 2022-03-02 DIAGNOSIS — Z1231 Encounter for screening mammogram for malignant neoplasm of breast: Secondary | ICD-10-CM

## 2022-03-29 ENCOUNTER — Ambulatory Visit
Admission: RE | Admit: 2022-03-29 | Discharge: 2022-03-29 | Disposition: A | Discharge status: Home / Self Care | Payer: BC Managed Care – PPO | Source: Ambulatory Visit | Attending: Nurse Practitioner | Admitting: Nurse Practitioner

## 2022-03-29 DIAGNOSIS — Z1231 Encounter for screening mammogram for malignant neoplasm of breast: Secondary | ICD-10-CM | POA: Insufficient documentation

## 2023-04-08 ENCOUNTER — Other Ambulatory Visit: Payer: Self-pay | Admitting: Nurse Practitioner

## 2023-04-08 DIAGNOSIS — Z1231 Encounter for screening mammogram for malignant neoplasm of breast: Secondary | ICD-10-CM

## 2023-04-09 ENCOUNTER — Ambulatory Visit
Admission: RE | Admit: 2023-04-09 | Discharge: 2023-04-09 | Disposition: A | Discharge status: Home / Self Care | Payer: BC Managed Care – PPO | Source: Ambulatory Visit | Attending: Nurse Practitioner | Admitting: Nurse Practitioner

## 2023-04-09 DIAGNOSIS — Z1231 Encounter for screening mammogram for malignant neoplasm of breast: Secondary | ICD-10-CM | POA: Diagnosis present

## 2023-07-15 ENCOUNTER — Ambulatory Visit: Payer: BC Managed Care – PPO

## 2023-07-15 DIAGNOSIS — K640 First degree hemorrhoids: Secondary | ICD-10-CM | POA: Diagnosis not present

## 2023-07-15 DIAGNOSIS — Z800 Family history of malignant neoplasm of digestive organs: Secondary | ICD-10-CM | POA: Diagnosis not present

## 2023-07-15 DIAGNOSIS — Z1211 Encounter for screening for malignant neoplasm of colon: Secondary | ICD-10-CM | POA: Diagnosis present

## 2023-07-15 DIAGNOSIS — Z83719 Family history of colon polyps, unspecified: Secondary | ICD-10-CM | POA: Diagnosis not present

## 2023-10-02 ENCOUNTER — Other Ambulatory Visit (HOSPITAL_COMMUNITY): Payer: Self-pay | Admitting: Nurse Practitioner

## 2023-10-02 DIAGNOSIS — Z136 Encounter for screening for cardiovascular disorders: Secondary | ICD-10-CM

## 2023-10-16 ENCOUNTER — Ambulatory Visit
Admission: RE | Admit: 2023-10-16 | Discharge: 2023-10-16 | Disposition: A | Discharge status: Home / Self Care | Payer: Self-pay | Source: Ambulatory Visit | Attending: Nurse Practitioner | Admitting: Nurse Practitioner

## 2023-10-16 DIAGNOSIS — Z136 Encounter for screening for cardiovascular disorders: Secondary | ICD-10-CM | POA: Insufficient documentation

## 2024-03-23 ENCOUNTER — Other Ambulatory Visit: Payer: Self-pay | Admitting: Nurse Practitioner

## 2024-03-23 DIAGNOSIS — Z1231 Encounter for screening mammogram for malignant neoplasm of breast: Secondary | ICD-10-CM

## 2024-04-14 ENCOUNTER — Ambulatory Visit
Admission: RE | Admit: 2024-04-14 | Discharge: 2024-04-14 | Disposition: A | Discharge status: Home / Self Care | Source: Ambulatory Visit | Attending: Nurse Practitioner | Admitting: Nurse Practitioner

## 2024-04-14 DIAGNOSIS — Z1231 Encounter for screening mammogram for malignant neoplasm of breast: Secondary | ICD-10-CM | POA: Diagnosis present
